# Patient Record
Sex: Female | Born: 1985 | Hispanic: Yes | Marital: Married | Smoking: Never smoker
Health system: Southern US, Community
[De-identification: ages and names within clinical notes are randomized; demographics above are authoritative.]

## PROBLEM LIST (undated history)

## (undated) ENCOUNTER — Inpatient Hospital Stay (HOSPITAL_COMMUNITY): Payer: Self-pay

## (undated) HISTORY — PX: CHOLECYSTECTOMY: SHX55

## (undated) HISTORY — PX: NO PAST SURGERIES: SHX2092

---

## 2011-10-30 ENCOUNTER — Other Ambulatory Visit (HOSPITAL_COMMUNITY): Payer: Self-pay | Admitting: Obstetrics and Gynecology

## 2011-10-30 DIAGNOSIS — Z3689 Encounter for other specified antenatal screening: Secondary | ICD-10-CM

## 2011-11-05 ENCOUNTER — Ambulatory Visit (HOSPITAL_COMMUNITY)
Admission: RE | Admit: 2011-11-05 | Discharge: 2011-11-05 | Disposition: A | Discharge status: Home / Self Care | Payer: Medicaid Other | Source: Ambulatory Visit | Attending: Obstetrics and Gynecology | Admitting: Obstetrics and Gynecology

## 2011-11-05 DIAGNOSIS — Z3689 Encounter for other specified antenatal screening: Secondary | ICD-10-CM

## 2011-11-05 DIAGNOSIS — Z363 Encounter for antenatal screening for malformations: Secondary | ICD-10-CM | POA: Insufficient documentation

## 2011-11-05 DIAGNOSIS — Z1389 Encounter for screening for other disorder: Secondary | ICD-10-CM | POA: Insufficient documentation

## 2011-11-05 DIAGNOSIS — O358XX0 Maternal care for other (suspected) fetal abnormality and damage, not applicable or unspecified: Secondary | ICD-10-CM | POA: Insufficient documentation

## 2011-11-13 LAB — OB RESULTS CONSOLE ANTIBODY SCREEN: Antibody Screen: NEGATIVE

## 2011-11-13 LAB — OB RESULTS CONSOLE RUBELLA ANTIBODY, IGM: Rubella: IMMUNE

## 2011-11-13 LAB — OB RESULTS CONSOLE HEPATITIS B SURFACE ANTIGEN: Hepatitis B Surface Ag: NEGATIVE

## 2011-11-13 LAB — OB RESULTS CONSOLE GC/CHLAMYDIA: Chlamydia: NEGATIVE

## 2011-11-13 LAB — OB RESULTS CONSOLE ABO/RH

## 2011-11-29 ENCOUNTER — Inpatient Hospital Stay (HOSPITAL_COMMUNITY)
Admission: AD | Admit: 2011-11-29 | Discharge: 2011-11-30 | Disposition: A | Discharge status: Home / Self Care | Payer: Medicaid Other | Source: Ambulatory Visit | Attending: Obstetrics and Gynecology | Admitting: Obstetrics and Gynecology

## 2011-11-29 ENCOUNTER — Encounter (HOSPITAL_COMMUNITY): Payer: Self-pay | Admitting: *Deleted

## 2011-11-29 ENCOUNTER — Emergency Department (HOSPITAL_COMMUNITY)
Admission: EM | Admit: 2011-11-29 | Discharge: 2011-11-29 | Payer: Medicaid Other | Attending: Emergency Medicine | Admitting: Emergency Medicine

## 2011-11-29 DIAGNOSIS — O4700 False labor before 37 completed weeks of gestation, unspecified trimester: Secondary | ICD-10-CM | POA: Insufficient documentation

## 2011-11-29 DIAGNOSIS — O479 False labor, unspecified: Secondary | ICD-10-CM

## 2011-11-29 DIAGNOSIS — R109 Unspecified abdominal pain: Secondary | ICD-10-CM | POA: Insufficient documentation

## 2011-11-29 NOTE — Progress Notes (Signed)
Discussed with patient of plan of care to transfer to [facility] via private vehicle for further evaluation. Pt verbalized understanding

## 2011-11-29 NOTE — Progress Notes (Signed)
Patient transferred to room A11 ambulatory

## 2011-11-29 NOTE — Progress Notes (Signed)
Report given to Dr. Jackelyn Knife. Patient may transfer to [facility] for further evaluation via private vehicle

## 2011-11-29 NOTE — ED Notes (Signed)
Patient said her contractions started at 0900 and pain started getting worse and she came in.  Contractions are 5-30mins apart.

## 2011-11-29 NOTE — Progress Notes (Signed)
Patient in triage room. FHR monitor not working at this time. Paper monitoring at this time

## 2011-11-29 NOTE — Progress Notes (Signed)
Patient in waiting room. Informed RN that OBRR RN here to evaluate patient

## 2011-11-29 NOTE — ED Notes (Signed)
Patient was explained discharge instructions and she had no questions.  Patient is being transported by POV by husband, Jerelene Redden to [facility] per Dr. Derrel Nip for further monitoring.

## 2011-11-29 NOTE — Progress Notes (Signed)
Received call from RN with report about patient

## 2011-11-29 NOTE — ED Notes (Signed)
Pt states lower pelvic contraction type pain. Pt states every 5-7 min. Pt states that they aren't too bad. Pt has 1 live child and this is pt 2nd pregnancy. Pt denies fluid or bleeding. Pt has not heard of braxton hicks contractions. OBGYN RN called will come by ED. Pt states feels baby move

## 2011-11-29 NOTE — ED Notes (Signed)
OB nurse Lanora Manis is present at bedside monitoring Fetal  Heart Tones.

## 2011-11-29 NOTE — ED Notes (Signed)
Telephoned OB Rapid Response RN Marisue Ivan [phone] and made her aware of patient presenting to ED with complaints of having contractions.

## 2011-11-29 NOTE — ED Provider Notes (Addendum)
History     CSN: [id number]  Arrival date & time 11/29/11  2111   First MD Initiated Contact with Patient 11/29/11 2248      Chief Complaint  Patient presents with  . Abdominal Pain    contractions type feeling    (Consider location/radiation/quality/duration/timing/severity/associated sxs/prior treatment) HPI This is a 26 year old female about 29-1/[redacted] weeks pregnant. This is her second pregnancy. She's been having pelvic pain since about noon today. The pains occur 5 to 7 minutes and are moderate in intensity. They're characterized as like contractions with previous pregnancy. She has had no rupture of membranes. She was placed on the monitor by her nurse and the OB rapid response nurse, Marisue Ivan, is present in the room at this time.   History reviewed. No pertinent past medical history.  Past Surgical History  Procedure Date  . Cholecystectomy     History reviewed. No pertinent family history.  History  Substance Use Topics  . Smoking status: Never Smoker   . Smokeless tobacco: Not on file  . Alcohol Use: No    OB History    Grav Para Term Preterm Abortions TAB SAB Ect Mult Living                  Review of Systems  All other systems reviewed and are negative.    Allergies  Review of patient's allergies indicates no known allergies.  Home Medications  No current outpatient prescriptions on file.  BP 121/78  Pulse 79  Temp 98.7 F (37.1 C) (Oral)  Resp 18  SpO2 98%  Physical Exam General: Well-developed, well-nourished female in no acute distress; appearance consistent with age of record HENT: normocephalic, atraumatic Eyes: pupils equal round and reactive to light; extraocular muscles intact Neck: supple Heart: regular rate and rhythm Lungs: clear to auscultation bilaterally Abdomen: soft; nontender; gravid, consistent with dates; bowel sounds present OB: Fetal heart tones 140s to 150s Extremities: No deformity; full range of motion; pulses normal; no  edema Neurologic: Awake, alert and oriented; motor function intact in all extremities and symmetric; no facial droop Skin: Warm and dry Psychiatric: Normal mood and affect    ED Course  Procedures (including critical care time)     MDM  11:04 PM Dr. Jackelyn Knife requests patient be transferred to [facility] MAU by private vehicle.  Nursing notes and vitals signs, including pulse oximetry, reviewed.  Summary of this visit's results, reviewed by myself:  Labs:  Results for orders placed during the hospital encounter of 11/29/11  OB RESULTS CONSOLE GC/CHLAMYDIA      Component Value Range   Gonorrhea Negative     Chlamydia Negative    OB RESULTS CONSOLE RPR      Component Value Range   RPR Nonreactive    OB RESULTS CONSOLE HIV ANTIBODY (ROUTINE TESTING)      Component Value Range   HIV Non-reactive    OB RESULTS CONSOLE RUBELLA ANTIBODY, IGM      Component Value Range   Rubella Immune    OB RESULTS CONSOLE HEPATITIS B SURFACE ANTIGEN      Component Value Range   Hepatitis B Surface Ag Negative    OB RESULTS CONSOLE ABO/RH      Component Value Range   RH Type  Positive     ABO Grouping O    OB RESULTS CONSOLE ANTIBODY SCREEN      Component Value Range   Antibody Screen Negative  Hanley Seamen, MD 11/29/11 1610  Hanley Seamen, MD 11/29/11 [phone]

## 2011-11-30 ENCOUNTER — Encounter (HOSPITAL_COMMUNITY): Payer: Self-pay | Admitting: *Deleted

## 2011-11-30 LAB — URINALYSIS, ROUTINE W REFLEX MICROSCOPIC
Nitrite: NEGATIVE
Protein, ur: NEGATIVE mg/dL
Specific Gravity, Urine: 1.02 (ref 1.005–1.030)
Urobilinogen, UA: 0.2 mg/dL (ref 0.0–1.0)

## 2011-11-30 MED ORDER — LACTATED RINGERS IV SOLN
INTRAVENOUS | Status: DC
  Administered 2011-11-30: 01:00:00 via INTRAVENOUS

## 2011-11-30 MED ORDER — TERBUTALINE SULFATE 1 MG/ML IJ SOLN
INTRAMUSCULAR | Status: AC
  Administered 2011-11-30: 01:00:00
  Filled 2011-11-30: qty 1

## 2011-11-30 MED ORDER — NIFEDIPINE 10 MG PO CAPS
20.0000 mg | ORAL_CAPSULE | ORAL | Status: AC
  Administered 2011-11-30 (×2): 20 mg via ORAL
  Filled 2011-11-30 (×2): qty 2

## 2011-11-30 MED ORDER — ZOLPIDEM TARTRATE 5 MG PO TABS
5.0000 mg | ORAL_TABLET | Freq: Once | ORAL | Status: AC
  Administered 2011-11-30: 5 mg via ORAL
  Filled 2011-11-30: qty 1

## 2011-11-30 MED ORDER — BETAMETHASONE SOD PHOS & ACET 6 (3-3) MG/ML IJ SUSP
12.0000 mg | Freq: Once | INTRAMUSCULAR | Status: AC
  Administered 2011-11-30: 12 mg via INTRAMUSCULAR
  Filled 2011-11-30: qty 2

## 2011-11-30 MED ORDER — TERBUTALINE SULFATE 1 MG/ML IJ SOLN: 0.2500 mg | Freq: Once | INTRAMUSCULAR | Status: DC

## 2011-11-30 NOTE — MAU Note (Signed)
Pt states that she went to [facility] ED and they sent her here for eval-she was montiored at [facility] and had a SVE done by "a nurse that came from here"

## 2011-11-30 NOTE — Progress Notes (Signed)
FHT from this am reviewed.  Reactive NST, consistent mostly regular ctx, but cervix did not change.

## 2011-11-30 NOTE — MAU Provider Note (Signed)
Chief Complaint:  Contractions  First Provider Initiated Contact with Patient 11/30/11 0040    HPI: Destiny Fernandez is a 26 y.o. G2P1001 at [redacted]w[redacted]d who was sent to maternity admissions from [facility] ED for preterm contractions. Rates pain 8/10 on pain scale. Cervix 1 and long per Rapid Response OB nurse. No fFN done. Denies leakage of fluid, vaginal bleeding, urinary complaints, GI complaints, IC in past 24 hours, Hx of PTL or PTD. Good fetal movement.   Past Medical History: History reviewed. No pertinent past medical history.  Past obstetric history: OB History    Grav Para Term Preterm Abortions TAB SAB Ect Mult Living   2 1 1  0 0 0 0 0 0 1     # Outc Date GA Lbr Len/2nd Wgt Sex Del Anes PTL Lv   1 TRM            2 CUR               Past Surgical History: Past Surgical History  Procedure Date  . Cholecystectomy        Family History: History reviewed. No pertinent family history.  Social History: History  Substance Use Topics  . Smoking status: Never Smoker   . Smokeless tobacco: Never Used  . Alcohol Use: No    Allergies: No Known Allergies  Meds:  Prescriptions prior to admission  Medication Sig Dispense Refill  . Prenatal Vit-Fe Fumarate-FA ([employer] PRENATAL PO) Take 1 tablet by mouth daily.        ROS: Pertinent findings in history of present illness.  Physical Exam  Blood pressure 122/75, pulse 79, temperature 99.1 F (37.3 C), temperature source Oral, resp. rate 20, height 5\' 2"  (1.575 m), weight 69.967 kg (154 lb 4 oz), last menstrual period 05/09/2011, SpO2 99.00%. GENERAL: Well-developed, well-nourished female in no acute distress.  HEENT: normocephalic HEART: normal rate RESP: normal effort ABDOMEN: Soft, non-tender, gravid appropriate for gestational age EXTREMITIES: Nontender, no edema NEURO: alert and oriented SPECULUM EXAM: deferred  Dilation: 1 Effacement (%): Thick Cervical Position: Posterior Exam by:: V.Jeanell Mangan,CNM  FHT:  Baseline 140 ,  moderate variability, no accelerations present, no decelerations Contractions: q 2-3 mins, mild   Labs: Results for orders placed during the hospital encounter of 11/29/11 (from the past 24 hour(s))  URINALYSIS, ROUTINE W REFLEX MICROSCOPIC     Status: Normal   Collection Time   11/30/11 12:01 AM      Component Value Range   Color, Urine YELLOW  YELLOW   APPearance CLEAR  CLEAR   Specific Gravity, Urine 1.020  1.005 - 1.030   pH 6.5  5.0 - 8.0   Glucose, UA NEGATIVE  NEGATIVE mg/dL   Hgb urine dipstick NEGATIVE  NEGATIVE   Bilirubin Urine NEGATIVE  NEGATIVE   Ketones, ur NEGATIVE  NEGATIVE mg/dL   Protein, ur NEGATIVE  NEGATIVE mg/dL   Urobilinogen, UA 0.2  0.0 - 1.0 mg/dL   Nitrite NEGATIVE  NEGATIVE   Leukocytes, UA NEGATIVE  NEGATIVE    Imaging:   MAU Course: 0045: Terbutaline Austin given per consult w/ Dr. Jackelyn Knife.  4540: Brief improvement after terbutaline and IV bolus, then return to UC's Q 3, 8/10 on pain scale. Cervix unchanged. Procardia 20 mg PRN x 2 doses ordered per Dr. Jackelyn Knife.Marland Kitchen  0515: No cervical change. Contractions unchanged. Will give BMZ and D/C per Dr. Jackelyn Knife. 0630: Requesting pain meds for D/C. Will not give any per Dr. Jackelyn Knife due to not wanting to mask contractions, but  may have Ambien, given. Cervix unchanged.  Assessment: 1. Preterm contractions    Plan: Discharge home Preterm labor precautions and fetal kick counts Return to MAU for BMZ #2 12/01/11 at 7:00. Increase rest and fluids. Follow-up Information    Call MEISINGER,TODD D, MD. (this afternoon to discuss how contractions are feeling )    Contact information:   [address], [address] [place] [address] [phone]       Follow up with [facility] ADMISS[facility]. On 12/01/2011. (At 7:00 am for second steroid shot or as needed if symptoms worsen)    Contact information:   [address] [id number] mc [place]  [address] [phone]           Medication List     As of 11/30/2011  7:32 AM    TAKE these medications         CVS PRENATAL PO   Take 1 tablet by mouth daily.         [place], CNM 11/30/2011 12:39 AM

## 2011-12-01 ENCOUNTER — Inpatient Hospital Stay (HOSPITAL_COMMUNITY)
Admission: AD | Admit: 2011-12-01 | Discharge: 2011-12-01 | Disposition: A | Discharge status: Home / Self Care | Payer: Medicaid Other | Source: Ambulatory Visit | Attending: Obstetrics and Gynecology | Admitting: Obstetrics and Gynecology

## 2011-12-01 DIAGNOSIS — O4700 False labor before 37 completed weeks of gestation, unspecified trimester: Secondary | ICD-10-CM | POA: Insufficient documentation

## 2011-12-01 MED ORDER — BETAMETHASONE SOD PHOS & ACET 6 (3-3) MG/ML IJ SUSP
12.0000 mg | Freq: Once | INTRAMUSCULAR | Status: AC
  Administered 2011-12-01: 12 mg via INTRAMUSCULAR
  Filled 2011-12-01: qty 2

## 2012-01-14 NOTE — L&D Delivery Note (Signed)
Delivery Note At 5:43 AM a viable female was delivered via Vaginal, Spontaneous Delivery (Presentation: Left Occiput Anterior).  APGAR: 8, 9; weight pending.   Placenta status: Intact, Spontaneous.  Cord:  with the following complications: None.   Anesthesia: None  Episiotomy: None Lacerations: None Suture Repair: none Est. Blood Loss (mL): 400  Mom to postpartum.  Baby to stay with mom.  Alante Tolan D 02/04/2012, 6:02 AM

## 2012-01-20 LAB — OB RESULTS CONSOLE GBS: GBS: NEGATIVE

## 2012-02-03 ENCOUNTER — Inpatient Hospital Stay (HOSPITAL_COMMUNITY)
Admission: AD | Admit: 2012-02-03 | Discharge: 2012-02-06 | DRG: 775 | Disposition: A | Discharge status: Home / Self Care | Payer: Medicaid Other | Source: Ambulatory Visit | Attending: Obstetrics and Gynecology | Admitting: Obstetrics and Gynecology

## 2012-02-03 DIAGNOSIS — IMO0001 Reserved for inherently not codable concepts without codable children: Secondary | ICD-10-CM

## 2012-02-04 ENCOUNTER — Encounter (HOSPITAL_COMMUNITY): Payer: Self-pay | Admitting: *Deleted

## 2012-02-04 DIAGNOSIS — IMO0001 Reserved for inherently not codable concepts without codable children: Secondary | ICD-10-CM

## 2012-02-04 LAB — RPR: RPR Ser Ql: NONREACTIVE

## 2012-02-04 LAB — TYPE AND SCREEN
ABO/RH(D): O POS
Antibody Screen: NEGATIVE

## 2012-02-04 LAB — POCT FERN TEST: POCT Fern Test: NEGATIVE

## 2012-02-04 LAB — CBC
HCT: 36.5 % (ref 36.0–46.0)
Hemoglobin: 12 g/dL (ref 12.0–15.0)
MCHC: 32.9 g/dL (ref 30.0–36.0)
RBC: 4.12 MIL/uL (ref 3.87–5.11)

## 2012-02-04 LAB — ABO/RH: ABO/RH(D): O POS

## 2012-02-04 MED ORDER — OXYCODONE-ACETAMINOPHEN 5-325 MG PO TABS
1.0000 | ORAL_TABLET | ORAL | Status: DC | PRN
Start: 2012-02-04 — End: 2012-02-04

## 2012-02-04 MED ORDER — LANOLIN HYDROUS EX OINT: TOPICAL_OINTMENT | CUTANEOUS | Status: DC | PRN

## 2012-02-04 MED ORDER — WITCH HAZEL-GLYCERIN EX PADS: 1.0000 "application " | MEDICATED_PAD | CUTANEOUS | Status: DC | PRN

## 2012-02-04 MED ORDER — OXYTOCIN BOLUS FROM INFUSION: 500.0000 mL | INTRAVENOUS | Status: DC

## 2012-02-04 MED ORDER — MEASLES, MUMPS & RUBELLA VAC ~~LOC~~ INJ: 0.5000 mL | INJECTION | Freq: Once | SUBCUTANEOUS | Status: DC

## 2012-02-04 MED ORDER — BENZOCAINE-MENTHOL 20-0.5 % EX AERO
1.0000 "application " | INHALATION_SPRAY | CUTANEOUS | Status: DC | PRN
  Administered 2012-02-04: 1 via TOPICAL
  Filled 2012-02-04: qty 56

## 2012-02-04 MED ORDER — SIMETHICONE 80 MG PO CHEW: 80.0000 mg | CHEWABLE_TABLET | ORAL | Status: DC | PRN

## 2012-02-04 MED ORDER — DIBUCAINE 1 % RE OINT: 1.0000 "application " | TOPICAL_OINTMENT | RECTAL | Status: DC | PRN

## 2012-02-04 MED ORDER — METHYLERGONOVINE MALEATE 0.2 MG/ML IJ SOLN: 0.2000 mg | INTRAMUSCULAR | Status: DC | PRN

## 2012-02-04 MED ORDER — BUTORPHANOL TARTRATE 1 MG/ML IJ SOLN
1.0000 mg | INTRAMUSCULAR | Status: DC | PRN
  Administered 2012-02-04 (×2): 1 mg via INTRAVENOUS
  Filled 2012-02-04 (×2): qty 1

## 2012-02-04 MED ORDER — MAGNESIUM HYDROXIDE 400 MG/5ML PO SUSP: 30.0000 mL | ORAL | Status: DC | PRN

## 2012-02-04 MED ORDER — SENNOSIDES-DOCUSATE SODIUM 8.6-50 MG PO TABS
2.0000 | ORAL_TABLET | Freq: Every day | ORAL | Status: DC
  Administered 2012-02-05 (×2): 2 via ORAL

## 2012-02-04 MED ORDER — IBUPROFEN 600 MG PO TABS: 600.0000 mg | ORAL_TABLET | Freq: Four times a day (QID) | ORAL | Status: DC | PRN

## 2012-02-04 MED ORDER — IBUPROFEN 600 MG PO TABS
600.0000 mg | ORAL_TABLET | Freq: Four times a day (QID) | ORAL | Status: DC
  Administered 2012-02-04 – 2012-02-06 (×7): 600 mg via ORAL
  Filled 2012-02-04 (×7): qty 1

## 2012-02-04 MED ORDER — LACTATED RINGERS IV SOLN: 500.0000 mL | INTRAVENOUS | Status: DC | PRN

## 2012-02-04 MED ORDER — OXYCODONE-ACETAMINOPHEN 5-325 MG PO TABS
1.0000 | ORAL_TABLET | ORAL | Status: DC | PRN
  Administered 2012-02-04 (×2): 1 via ORAL
  Administered 2012-02-05 – 2012-02-06 (×5): 2 via ORAL
  Filled 2012-02-04: qty 1
  Filled 2012-02-04: qty 2
  Filled 2012-02-04: qty 1
  Filled 2012-02-04 (×4): qty 2

## 2012-02-04 MED ORDER — CITRIC ACID-SODIUM CITRATE 334-500 MG/5ML PO SOLN: 30.0000 mL | ORAL | Status: DC | PRN

## 2012-02-04 MED ORDER — DIPHENHYDRAMINE HCL 25 MG PO CAPS: 25.0000 mg | ORAL_CAPSULE | Freq: Four times a day (QID) | ORAL | Status: DC | PRN

## 2012-02-04 MED ORDER — ONDANSETRON HCL 4 MG/2ML IJ SOLN: 4.0000 mg | Freq: Four times a day (QID) | INTRAMUSCULAR | Status: DC | PRN

## 2012-02-04 MED ORDER — LACTATED RINGERS IV SOLN
INTRAVENOUS | Status: DC
  Administered 2012-02-04 (×2): via INTRAVENOUS

## 2012-02-04 MED ORDER — PRENATAL MULTIVITAMIN CH
1.0000 | ORAL_TABLET | Freq: Every day | ORAL | Status: DC
  Administered 2012-02-04 – 2012-02-06 (×3): 1 via ORAL
  Filled 2012-02-04 (×3): qty 1

## 2012-02-04 MED ORDER — ZOLPIDEM TARTRATE 5 MG PO TABS: 5.0000 mg | ORAL_TABLET | Freq: Every evening | ORAL | Status: DC | PRN

## 2012-02-04 MED ORDER — METHYLERGONOVINE MALEATE 0.2 MG PO TABS: 0.2000 mg | ORAL_TABLET | ORAL | Status: DC | PRN

## 2012-02-04 MED ORDER — ONDANSETRON HCL 4 MG/2ML IJ SOLN: 4.0000 mg | INTRAMUSCULAR | Status: DC | PRN

## 2012-02-04 MED ORDER — OXYTOCIN 40 UNITS IN LACTATED RINGERS INFUSION - SIMPLE MED
62.5000 mL/h | INTRAVENOUS | Status: DC
  Administered 2012-02-04: 999 mL/h via INTRAVENOUS
  Filled 2012-02-04 (×2): qty 1000

## 2012-02-04 MED ORDER — FLEET ENEMA 7-19 GM/118ML RE ENEM: 1.0000 | ENEMA | RECTAL | Status: DC | PRN

## 2012-02-04 MED ORDER — TETANUS-DIPHTH-ACELL PERTUSSIS 5-2.5-18.5 LF-MCG/0.5 IM SUSP: 0.5000 mL | Freq: Once | INTRAMUSCULAR | Status: DC

## 2012-02-04 MED ORDER — ACETAMINOPHEN 325 MG PO TABS: 650.0000 mg | ORAL_TABLET | ORAL | Status: DC | PRN

## 2012-02-04 MED ORDER — ONDANSETRON HCL 4 MG PO TABS
4.0000 mg | ORAL_TABLET | ORAL | Status: DC | PRN
Start: 2012-02-04 — End: 2012-02-06

## 2012-02-04 MED ORDER — LIDOCAINE HCL (PF) 1 % IJ SOLN
30.0000 mL | INTRAMUSCULAR | Status: DC | PRN
  Filled 2012-02-04: qty 30

## 2012-02-04 NOTE — MAU Provider Note (Signed)
Speculum exam:  Done to evaluate for rupture of membranes.  No pooling with valsalva.  Has lots of cervical mucus with blood - some pink, some red.  Fern slide made for RN to read.  Clinical observational assessment - unlikely that membranes have ruptured.

## 2012-02-04 NOTE — MAU Note (Signed)
Patient complains of contractions since the day before yesterday. Was seen at the office yesterday and was 2cm. For the last two hours the contractions have been more intense and happening every 5-7 mins

## 2012-02-04 NOTE — Progress Notes (Signed)
UR chart review completed.  

## 2012-02-04 NOTE — MAU Note (Signed)
Patient returned from walking stating the bad was "too bad to walk anymore". Also that she noticed some leaking of fluid.

## 2012-02-04 NOTE — H&P (Signed)
Destiny Fernandez is a 27 y.o. female, G2 P1001, EGA 39+ weeks with [facility] 1-27 presenting for evaluation of reg ctx.  On eval in MAU, ctx q 5 min, cervix chaanged from 3 to 3-4 cm over an hour.  Pt admitted and continued to progress, unsure of membrane rupture.  On my first exam, she was a rim dilated, no membranes palpable.  Prenatal care complicated by preterm ctx at 29 weeks, received BMZ, no other complications.  See prenatal records for complete history.  Maternal Medical History:  Reason for admission: Reason for admission: contractions.  Contractions: Frequency: regular.   Perceived severity is strong.    Fetal activity: Perceived fetal activity is normal.    Prenatal complications: no prenatal complications   OB History    Grav Para Term Preterm Abortions TAB SAB Ect Mult Living   2 1 1  0 0 0 0 0 0 1    SVD at 40 weeks, 6 lbs 2 oz, no comps  History reviewed. No pertinent past medical history. Past Surgical History  Procedure Date  . Cholecystectomy   . No past surgeries    Family History: family history is not on file. Social History:  reports that she has never smoked. She has never used smokeless tobacco. She reports that she does not drink alcohol or use illicit drugs.   Prenatal Transfer Tool  Maternal Diabetes: No Genetic Screening: Declined Maternal Ultrasounds/Referrals: Normal Fetal Ultrasounds or other Referrals:  None Maternal Substance Abuse:  No Significant Maternal Medications:  None Significant Maternal Lab Results:  Lab values include: Group B Strep negative Other Comments:  None  Review of Systems  Respiratory: Negative.   Cardiovascular: Negative.     Dilation: 9 Effacement (%): 100 Station: 0 Exam by:: T Cresenzo RN Blood pressure 126/75, pulse 92, temperature 98.5 F (36.9 C), temperature source Tympanic, resp. rate 18, height 5\' 2"  (1.575 m), weight 72.122 kg (159 lb), last menstrual period 05/09/2011. Maternal Exam:  Uterine Assessment:  Contraction strength is firm.  Contraction frequency is regular.   Abdomen: Patient reports no abdominal tenderness. Estimated fetal weight is 6 lbs.   Fetal presentation: vertex  Introitus: Normal vulva. Normal vagina.  Pelvis: adequate for delivery.   Cervix: Cervix evaluated by digital exam.     Fetal Exam Fetal Monitor Review: Mode: ultrasound.   Variability: moderate (6-25 bpm).   Pattern: accelerations present and no decelerations.    Fetal State Assessment: Category I - tracings are normal.     Physical Exam  Constitutional: She appears well-developed and well-nourished.  Cardiovascular: Normal rate, regular rhythm and normal heart sounds.   No murmur heard. Respiratory: Effort normal and breath sounds normal. No respiratory distress.  GI: Soft.       Gravid     Prenatal labs: ABO, Rh: --/--/O POS, O POS (01/22 0230) Antibody: NEG (01/22 0230) Rubella: Immune (10/31 0000) RPR: Nonreactive (10/31 0000)  HBsAg: Negative (10/31 0000)  HIV: Non-reactive (10/31 0000)  GBS: Negative (01/07 0000)  GCT:  103  Assessment/Plan: IUP at 39+ weeks in active labor, making good progress, see delivery note.   Sylis Ketchum D 02/04/2012, 5:56 AM

## 2012-02-05 NOTE — Progress Notes (Signed)
PPD #1 No problems Afeb, VSS Fundus firm, NT at U-1 Continue routine postpartum care 

## 2012-02-06 MED ORDER — OXYCODONE-ACETAMINOPHEN 5-325 MG PO TABS: 1.0000 | ORAL_TABLET | ORAL | Status: AC | PRN

## 2012-02-06 MED ORDER — OXYCODONE-ACETAMINOPHEN 5-325 MG PO TABS: 1.0000 | ORAL_TABLET | ORAL | Status: DC | PRN

## 2012-02-06 MED ORDER — IBUPROFEN 600 MG PO TABS: 600.0000 mg | ORAL_TABLET | Freq: Four times a day (QID) | ORAL | Status: AC

## 2012-02-06 NOTE — Discharge Summary (Signed)
Obstetric Discharge Summary Reason for Admission: onset of labor Prenatal Procedures: none Intrapartum Procedures: spontaneous vaginal delivery Postpartum Procedures: none Complications-Operative and Postpartum: none Hemoglobin  Date Value Range Status  02/04/2012 12.0  12.0 - 15.0 g/dL Final     HCT  Date Value Range Status  02/04/2012 36.5  36.0 - 46.0 % Final    Physical Exam:  General: alert Lochia: appropriate Uterine Fundus: firm  Discharge Diagnoses: Term Pregnancy-delivered  Discharge Information: Date: 02/06/2012 Activity: pelvic rest Diet: routine Medications: Ibuprofen and Percocet Condition: stable Instructions: refer to practice specific booklet Discharge to: home Follow-up Information    Follow up with Randol Zumstein D, MD. Schedule an appointment as soon as possible for a visit in 6 weeks.   Contact information:   [address], [address] [place] [address] [phone]          Newborn Data: Live born female  Birth Weight: 5 lb 6 oz (2438 g) APGAR: 8, 9  Home with mother.  Kenta Laster D 02/06/2012, 9:23 AM

## 2012-02-06 NOTE — Progress Notes (Signed)
PPD #2 No problems Afeb, VSS D/c home 

## 2012-02-12 ENCOUNTER — Ambulatory Visit (HOSPITAL_COMMUNITY)
Admit: 2012-02-12 | Discharge: 2012-02-12 | Disposition: A | Discharge status: Home / Self Care | Payer: Medicaid Other | Attending: Obstetrics and Gynecology | Admitting: Obstetrics and Gynecology

## 2012-02-12 NOTE — Progress Notes (Signed)
Adult Lactation Consultation Outpatient Visit Note  Patient Name: Destiny Fernandez Date of Birth: February 28, 1985 Gestational Age at Delivery: Unknown Type of Delivery: Vaginal  Birth weight 5# 6oz   Breastfeeding History: Frequency of Breastfeeding: 12 times in 24 hours Length of Feeding:  Voids: 6+ Stools: 5+  Supplementing / Method: Pumping:  Type of Pump: not pumping   Frequency:  Volume:    Comments: Has been using a nipple shield and has been unable to [employer] with out it.  Helped her with positioning and [name] was able to latch without the shield.  Mom was able to latch her with coaching.    Consultation Evaluation:  Initial Feeding Assessment: Pre-feed [id number]:0454 Post-feed UJWJXB:1478 Amount Transferred:70 Comments:  Additional Feeding Assessment: Pre-feed GNFAOZ:3086 Post-feed VHQION:6295 (fell asleep) Amount Transferred:2 ml Comments:Could not latch to the left side without the NS.  Decreased size to a 20 mm. [name] latched but quickly fell asleep.  Additional Feeding Assessment: Pre-feed Weight: Post-feed Weight: Amount Transferred: Comments:  Total Breast milk Transferred this Visit:  Total Supplement Given:   Additional Interventions:   Follow-Up  Will continue trying to latch without the NS.  Smart Start to weigh on Monday and to call with the weight to lactation.  Lactation appointment scheduled for Thursday 02/19/12    Soyla Dryer 02/12/2012, 2:45 PM

## 2012-02-19 ENCOUNTER — Ambulatory Visit (HOSPITAL_COMMUNITY): Admission: RE | Admit: 2012-02-19 | Payer: Medicaid Other | Source: Ambulatory Visit

## 2013-11-14 ENCOUNTER — Encounter (HOSPITAL_COMMUNITY): Payer: Self-pay | Admitting: *Deleted

## 2016-05-12 ENCOUNTER — Encounter (HOSPITAL_COMMUNITY): Payer: Self-pay | Admitting: Emergency Medicine

## 2016-05-12 ENCOUNTER — Emergency Department (HOSPITAL_COMMUNITY)
Admission: EM | Admit: 2016-05-12 | Discharge: 2016-05-13 | Disposition: A | Discharge status: Home / Self Care | Payer: Medicaid Other | Attending: Emergency Medicine | Admitting: Emergency Medicine

## 2016-05-12 DIAGNOSIS — B349 Viral infection, unspecified: Secondary | ICD-10-CM | POA: Diagnosis not present

## 2016-05-12 DIAGNOSIS — K2970 Gastritis, unspecified, without bleeding: Secondary | ICD-10-CM | POA: Insufficient documentation

## 2016-05-12 DIAGNOSIS — R1010 Upper abdominal pain, unspecified: Secondary | ICD-10-CM | POA: Diagnosis present

## 2016-05-12 LAB — BASIC METABOLIC PANEL
Anion gap: 11 (ref 5–15)
BUN: 12 mg/dL (ref 6–20)
CO2: 22 mmol/L (ref 22–32)
CREATININE: 0.66 mg/dL (ref 0.44–1.00)
Calcium: 9.4 mg/dL (ref 8.9–10.3)
Chloride: 106 mmol/L (ref 101–111)
GFR calc Af Amer: >60 mL/min (ref >60)
Glucose, Bld: 86 mg/dL (ref 65–99)
Potassium: 3.4 mmol/L — ABNORMAL LOW (ref 3.5–5.1)
SODIUM: 139 mmol/L (ref 135–145)

## 2016-05-12 LAB — URINALYSIS, ROUTINE W REFLEX MICROSCOPIC
Bacteria, UA: NONE SEEN
Bilirubin Urine: NEGATIVE
GLUCOSE, UA: NEGATIVE mg/dL
Hgb urine dipstick: NEGATIVE
KETONES UR: 80 mg/dL — AB
Leukocytes, UA: NEGATIVE
Nitrite: NEGATIVE
PH: 5 (ref 5.0–8.0)
PROTEIN: 30 mg/dL — AB
Specific Gravity, Urine: 1.029 (ref 1.005–1.030)

## 2016-05-12 LAB — CBC WITH DIFFERENTIAL/PLATELET
BASOS ABS: 0 10*3/uL (ref 0.0–0.1)
Basophils Relative: 0 %
Eosinophils Absolute: 0 10*3/uL (ref 0.0–0.7)
Eosinophils Relative: 0 %
HEMATOCRIT: 38.1 % (ref 36.0–46.0)
Hemoglobin: 13 g/dL (ref 12.0–15.0)
LYMPHS PCT: 11 %
Lymphs Abs: 1 10*3/uL (ref 0.7–4.0)
MCH: 31.6 pg (ref 26.0–34.0)
MCHC: 34.1 g/dL (ref 30.0–36.0)
MCV: 92.7 fL (ref 78.0–100.0)
MONO ABS: 0.4 10*3/uL (ref 0.1–1.0)
MONOS PCT: 4 %
NEUTROS ABS: 7.6 10*3/uL (ref 1.7–7.7)
Neutrophils Relative %: 85 %
Platelets: 190 10*3/uL (ref 150–400)
RBC: 4.11 MIL/uL (ref 3.87–5.11)
RDW: 12.3 % (ref 11.5–15.5)
WBC: 9 10*3/uL (ref 4.0–10.5)

## 2016-05-12 NOTE — ED Triage Notes (Signed)
Pt having 7/10 generalize body ache with nausea and vomiting since last Saturday. No able to eat or drink nothing. Denies any fever or chills.

## 2016-05-13 LAB — HEPATIC FUNCTION PANEL
ALK PHOS: 61 U/L (ref 38–126)
ALT: 16 U/L (ref 14–54)
AST: 18 U/L (ref 15–41)
Albumin: 4.1 g/dL (ref 3.5–5.0)
BILIRUBIN DIRECT: 0.1 mg/dL (ref 0.1–0.5)
BILIRUBIN INDIRECT: 0.5 mg/dL (ref 0.3–0.9)
Total Bilirubin: 0.6 mg/dL (ref 0.3–1.2)
Total Protein: 7.8 g/dL (ref 6.5–8.1)

## 2016-05-13 LAB — LIPASE, BLOOD: Lipase: 23 U/L (ref 11–51)

## 2016-05-13 MED ORDER — ONDANSETRON HCL 4 MG PO TABS: 4.0000 mg | ORAL_TABLET | Freq: Four times a day (QID) | ORAL | 0 refills | Status: AC

## 2016-05-13 MED ORDER — ONDANSETRON 4 MG PO TBDP
4.0000 mg | ORAL_TABLET | Freq: Once | ORAL | Status: AC
  Administered 2016-05-13: 4 mg via ORAL
  Filled 2016-05-13: qty 1

## 2016-05-13 NOTE — ED Provider Notes (Signed)
[facility]-EMERGENCY DEPT Provider Note   CSN: [id number] Arrival date & time: 05/12/16  1938     History   Chief Complaint Chief Complaint  Patient presents with  . Generalized Body Aches  . Emesis    HPI Destiny Fernandez is a 31 y.o. female.  HPI  31 y.o. female, presents to the Emergency Department today complaining of upper abdominal pain x 1 week with associated nausea. Noted emesis today PTA x 1. Notes no diarrhea. No fevers. No CP/SOB. Rates abdominal pain 7/10 in RUQ and feels like cramping sensation. Pt has had cholecystectomy in past. Notes no meds PTA. Pt is able to tolerate PO. Notes normal BMS. No other symptoms noted.   History reviewed. No pertinent past medical history.  There are no active problems to display for this patient.   Past Surgical History:  Procedure Laterality Date  . CHOLECYSTECTOMY    . NO PAST SURGERIES      OB History    Gravida Para Term Preterm AB Living   0 0 2   SAB TAB Ectopic Multiple Live Births   0 0 0 0 1       Home Medications    Prior to Admission medications   Medication Sig Start Date End Date Taking? Authorizing Provider  ibuprofen (ADVIL,MOTRIN) 600 MG tablet Take 1 tablet (600 mg total) by mouth every 6 (six) hours. 02/06/12   Lavina Hamman, MD  oxyCODONE-acetaminophen (PERCOCET/ROXICET) 5-325 MG per tablet Take 1 tablet by mouth every 4 (four) hours as needed (moderate - severe pain). 02/06/12   Lavina Hamman, MD  Prenatal Vit-Fe Fumarate-FA ([facility] PRENATAL PO) Take 1 tablet by mouth daily.    Historical Provider, MD    Family History History reviewed. No pertinent family history.  Social History Social History  Substance Use Topics  . Smoking status: Never Smoker  . Smokeless tobacco: Never Used  . Alcohol use No     Allergies   Patient has no known allergies.   Review of Systems Review of Systems ROS reviewed and all are negative for acute change except as noted in the HPI.  Physical  Exam Updated Vital Signs BP 101/74 (BP Location: Right Arm)   Pulse 61   Temp 98.9 F (37.2 C) (Oral)   Resp 18   Ht  (1.549 m)   Wt 67.6 kg   LMP 05/12/2016   SpO2 98%   BMI 28.15 kg/m   Physical Exam  Constitutional: She is oriented to person, place, and time. Vital signs are normal. She appears well-developed and well-nourished.  HENT:  Head: Normocephalic and atraumatic.  Right Ear: Hearing normal.  Left Ear: Hearing normal.  Eyes: Conjunctivae and EOM are normal. Pupils are equal, round, and reactive to light.  Neck: Normal range of motion. Neck supple.  Cardiovascular: Normal rate, regular rhythm and intact distal pulses.   Pulmonary/Chest: Effort normal and breath sounds normal. No respiratory distress. She has no wheezes. She has no rales. She exhibits no tenderness.  Abdominal: Soft. Normal appearance and bowel sounds are normal. There is no tenderness. There is no rigidity, no rebound, no guarding, no CVA tenderness, no tenderness at McBurney's point and negative Murphy's sign.  Musculoskeletal: Normal range of motion.  Neurological: She is alert and oriented to person, place, and time.  Skin: Skin is warm and dry.  Psychiatric: She has a normal mood and affect. Her speech is normal and behavior is normal. Thought content normal.  Nursing note and  vitals reviewed.   ED Treatments / Results  Labs (all labs ordered are listed, but only abnormal results are displayed) Labs Reviewed  BASIC METABOLIC PANEL - Abnormal; Notable for the following:       Result Value   Potassium 3.4 (*)    All other components within normal limits  URINALYSIS, ROUTINE W REFLEX MICROSCOPIC - Abnormal; Notable for the following:    APPearance HAZY (*)    Ketones, ur 80 (*)    Protein, ur 30 (*)    Squamous Epithelial / LPF 0-5 (*)    All other components within normal limits  CBC WITH DIFFERENTIAL/PLATELET  HEPATIC FUNCTION PANEL  LIPASE, BLOOD    EKG  EKG  Interpretation None       Radiology No results found.  Procedures Procedures (including critical care time)  Medications Ordered in ED Medications  ondansetron (ZOFRAN-ODT) disintegrating tablet 4 mg (4 mg Oral Given 05/13/16 0029)   Initial Impression / Assessment and Plan / ED Course  I have reviewed the triage vital signs and the nursing notes.  Pertinent labs & imaging results that were available during my care of the patient were reviewed by me and considered in my medical decision making (see chart for details).  Final Clinical Impressions(s) / ED Diagnoses  {I have reviewed and evaluated the relevant laboratory values.   {I have reviewed the relevant previous healthcare records.  {I obtained HPI from historian.   ED Course:  Assessment: Patient is a 31 y.o. female presents with abdominal pain x x 1 week. Epigastric. Associated N/V. No diarrhea. Pt without gallbladder. No fevers. On exam, nontoxic, nonseptic appearing, in no apparent distress. Patient's pain and other symptoms adequately managed in emergency department.  Labs and vitals reviewed.  Patient does not meet the SIRS or Sepsis criteria.  On repeat exam patient does not have a surgical abdomen and there are no peritoneal signs.  No indication of appendicitis, bowel obstruction, bowel perforation, cholecystitis, diverticulitis, PID or ectopic pregnancy. Likely gastritis vs viral syndrome. PO challenged with Zofran with succecss.  Patient discharged home with symptomatic treatment and given strict instructions for follow-up with their primary care physician.  I have also discussed reasons to return immediately to the ER.  Patient expresses understanding and agrees with plan.  Disposition/Plan:  DC Home Additional Verbal discharge instructions given and discussed with patient.  Pt Instructed to f/u with PCP in the next week for evaluation and treatment of symptoms. Return precautions given Pt acknowledges and agrees with  plan  Supervising Physician Zadie Rhine, MD  Final diagnoses:  Viral syndrome  Gastritis without bleeding, unspecified chronicity, unspecified gastritis type    New Prescriptions New Prescriptions   No medications on file     Audry Pili, PA-C 05/13/16 0131    Zadie Rhine, MD 05/13/16 [phone]

## 2016-05-13 NOTE — ED Notes (Signed)
Pt provided with ice water and instructed to notify RN if she vomits. Will cont to monitor.

## 2016-05-13 NOTE — Discharge Instructions (Signed)
Please read and follow all provided instructions.  Your diagnoses today include:  1. Viral syndrome   2. Gastritis without bleeding, unspecified chronicity, unspecified gastritis type    Tests performed today include: Blood counts and electrolytes Blood tests to check liver and kidney function Blood tests to check pancreas function Urine test to look for infection and pregnancy (in women) Vital signs. See below for your results today.   Medications prescribed:   Take any prescribed medications only as directed.  Home care instructions:  Follow any educational materials contained in this packet.  Follow-up instructions: Please follow-up with your primary care provider in the next 2 days for further evaluation of your symptoms.    Return instructions:  SEEK IMMEDIATE MEDICAL ATTENTION IF: The pain does not go away or becomes severe  A temperature above 101F develops  Repeated vomiting occurs (multiple episodes)  The pain becomes localized to portions of the abdomen. The right side could possibly be appendicitis. In an adult, the left lower portion of the abdomen could be colitis or diverticulitis.  Blood is being passed in stools or vomit (bright red or black tarry stools)  You develop chest pain, difficulty breathing, dizziness or fainting, or become confused, poorly responsive, or inconsolable (young children) If you have any other emergent concerns regarding your health  Additional Information: Abdominal (belly) pain can be caused by many things. Your caregiver performed an examination and possibly ordered blood/urine tests and imaging (CT scan, x-rays, ultrasound). Many cases can be observed and treated at home after initial evaluation in the emergency department. Even though you are being discharged home, abdominal pain can be unpredictable. Therefore, you need a repeated exam if your pain does not resolve, returns, or worsens. Most patients with abdominal pain don't have to be  admitted to the hospital or have surgery, but serious problems like appendicitis and gallbladder attacks can start out as nonspecific pain. Many abdominal conditions cannot be diagnosed in one visit, so follow-up evaluations are very important.  Your vital signs today were: BP 109/77    Pulse (!) 59    Temp 98.9 F (37.2 C) (Oral)    Resp 18    Ht  (1.549 m)    Wt 67.6 kg    LMP 05/12/2016    SpO2 98%    BMI 28.15 kg/m  If your blood pressure (bp) was elevated above 135/85 this visit, please have this repeated by your doctor within one month. --------------

## 2018-08-15 ENCOUNTER — Emergency Department (HOSPITAL_COMMUNITY): Payer: Medicaid Other

## 2018-08-15 ENCOUNTER — Encounter (HOSPITAL_COMMUNITY): Payer: Self-pay | Admitting: Emergency Medicine

## 2018-08-15 ENCOUNTER — Other Ambulatory Visit: Payer: Self-pay

## 2018-08-15 ENCOUNTER — Emergency Department (HOSPITAL_COMMUNITY)
Admission: EM | Admit: 2018-08-15 | Discharge: 2018-08-16 | Disposition: A | Discharge status: Home / Self Care | Payer: Medicaid Other | Attending: Emergency Medicine | Admitting: Emergency Medicine

## 2018-08-15 DIAGNOSIS — R29810 Facial weakness: Secondary | ICD-10-CM | POA: Diagnosis present

## 2018-08-15 DIAGNOSIS — Z79899 Other long term (current) drug therapy: Secondary | ICD-10-CM | POA: Diagnosis not present

## 2018-08-15 DIAGNOSIS — G510 Bell's palsy: Secondary | ICD-10-CM | POA: Diagnosis not present

## 2018-08-15 LAB — CBC
HCT: 41.1 % (ref 36.0–46.0)
Hemoglobin: 13.6 g/dL (ref 12.0–15.0)
MCH: 32 pg (ref 26.0–34.0)
MCHC: 33.1 g/dL (ref 30.0–36.0)
MCV: 96.7 fL (ref 80.0–100.0)
Platelets: 201 10*3/uL (ref 150–400)
RBC: 4.25 MIL/uL (ref 3.87–5.11)
RDW: 12.4 % (ref 11.5–15.5)
WBC: 7.3 10*3/uL (ref 4.0–10.5)
nRBC: 0 % (ref 0.0–0.2)

## 2018-08-15 LAB — BASIC METABOLIC PANEL
Anion gap: 6 (ref 5–15)
BUN: 15 mg/dL (ref 6–20)
CO2: 25 mmol/L (ref 22–32)
Calcium: 9.4 mg/dL (ref 8.9–10.3)
Chloride: 108 mmol/L (ref 98–111)
Creatinine, Ser: 0.74 mg/dL (ref 0.44–1.00)
GFR calc Af Amer: >60 mL/min (ref >60)
GFR calc non Af Amer: >60 mL/min (ref >60)
Glucose, Bld: 98 mg/dL (ref 70–99)
Potassium: 3.7 mmol/L (ref 3.5–5.1)
Sodium: 139 mmol/L (ref 135–145)

## 2018-08-15 LAB — CBG MONITORING, ED: Glucose-Capillary: 86 mg/dL (ref 70–99)

## 2018-08-15 MED ORDER — ARTIFICIAL TEARS OPHTHALMIC OINT: TOPICAL_OINTMENT | OPHTHALMIC | 0 refills | Status: AC | PRN

## 2018-08-15 MED ORDER — PREDNISONE 20 MG PO TABS: ORAL_TABLET | ORAL | 0 refills | Status: AC

## 2018-08-15 MED ORDER — GADOBUTROL 1 MMOL/ML IV SOLN
6.0000 mL | Freq: Once | INTRAVENOUS | Status: AC | PRN
  Administered 2018-08-15: 6 mL via INTRAVENOUS

## 2018-08-15 MED ORDER — VALACYCLOVIR HCL 500 MG PO TABS
1000.0000 mg | ORAL_TABLET | Freq: Once | ORAL | Status: AC
  Administered 2018-08-15: 1000 mg via ORAL
  Filled 2018-08-15: qty 2

## 2018-08-15 MED ORDER — PREDNISONE 20 MG PO TABS
60.0000 mg | ORAL_TABLET | Freq: Once | ORAL | Status: AC
  Administered 2018-08-15: 60 mg via ORAL
  Filled 2018-08-15: qty 3

## 2018-08-15 MED ORDER — VALACYCLOVIR HCL 1 G PO TABS: 1000.0000 mg | ORAL_TABLET | Freq: Three times a day (TID) | ORAL | 0 refills | Status: AC

## 2018-08-15 NOTE — ED Provider Notes (Signed)
[facility] EMERGENCY [facility]PART[facility] Provider Note   CSN: [id number] Arrival date & time: 08/15/18  1906    History   Chief Complaint Chief Complaint  Patient presents with   Facial Droop    HPI Destiny Fernandez is a 33 y.o. female.     HPI  33 year old female presents with right facial droop and right arm heaviness.  Woke up yesterday with decreased ability to close her right eye, right facial droop.  Some blurry vision in the right eye.  No headache.  Today she has noticed a little bit of right neck pain and like her right arm is heavy compared to the left.  No numbness.  No chest pain, shortness of breath, vomiting.  No symptoms in her legs. No fevers.  History reviewed. No pertinent past medical history.  There are no active problems to display for this patient.   Past Surgical History:  Procedure Laterality Date   CHOLECYSTECTOMY     NO PAST SURGERIES       OB History    Gravida  2   Para  2   Term  2   Preterm  0   AB  0   Living  2     SAB  0   TAB  0   Ectopic  0   Multiple  0   Live Births  1            Home Medications    Prior to Admission medications   Medication Sig Start Date End Date Taking? Authorizing Provider  artificial tears (LACRILUBE) OINT ophthalmic ointment Place into both eyes as needed for dry eyes. 08/15/18   Pricilla LovelessGoldston, Kaysen Deal, MD  ibuprofen (ADVIL,MOTRIN) 600 MG tablet Take 1 tablet (600 mg total) by mouth every 6 (six) hours. 02/06/12   Meisinger, Todd, MD  ondansetron (ZOFRAN) 4 MG tablet Take 1 tablet (4 mg total) by mouth every 6 (six) hours. 05/13/16   Audry PiliMohr, Tyler, PA-C  oxyCODONE-acetaminophen (PERCOCET/ROXICET) 5-325 MG per tablet Take 1 tablet by mouth every 4 (four) hours as needed (moderate - severe pain). 02/06/12   Meisinger, Tawanna Coolerodd, MD  predniSONE (DELTASONE) 20 MG tablet 3 tabs po daily x 2 days, then 2 tabs x 3 days, then 1.5 tabs x 3 days, then 1 tab x 3 days, then 0.5 tabs x 3 days 08/15/18    Pricilla LovelessGoldston, Vivian Okelley, MD  Prenatal Vit-Fe Fumarate-FA ([employer] PRENATAL PO) Take 1 tablet by mouth daily.    [provider]  valACYclovir (VALTREX) 1000 MG tablet Take 1 tablet (1,000 mg total) by mouth 3 (three) times daily. 08/15/18   Pricilla LovelessGoldston, Shulem Mader, MD    Family History No family history on file.  Social History Social History   Tobacco Use   Smoking status: Never Smoker   Smokeless tobacco: Never Used  Substance Use Topics   Alcohol use: No   Drug use: No     Allergies   Patient has no known allergies.   Review of Systems Review of Systems  Constitutional: Negative for fever.  Eyes: Positive for visual disturbance.  Respiratory: Negative for shortness of breath.   Cardiovascular: Negative for chest pain.  Gastrointestinal: Negative for vomiting.  Neurological: Positive for weakness. Negative for numbness and headaches.  All other systems reviewed and are negative.    Physical Exam Updated Vital Signs BP (!) 120/97 (BP Location: Right Arm)    Pulse 64    Temp 99.4 F (37.4 C)    Resp (!)  8    Ht 5\' 2"  (1.575 m)    Wt 65.8 kg    LMP 08/15/2018    SpO2 98%    BMI 26.52 kg/m   Physical Exam Vitals signs and nursing note reviewed.  Constitutional:      General: She is not in acute distress.    Appearance: She is well-developed. She is not ill-appearing or diaphoretic.  HENT:     Head: Normocephalic and atraumatic.     Right Ear: External ear normal.     Left Ear: External ear normal.     Nose: Nose normal.  Eyes:     General:        Right eye: No discharge.        Left eye: No discharge.  Cardiovascular:     Rate and Rhythm: Normal rate and regular rhythm.     Heart sounds: Normal heart sounds.  Pulmonary:     Effort: Pulmonary effort is normal.     Breath sounds: Normal breath sounds.  Abdominal:     Palpations: Abdomen is soft.     Tenderness: There is no abdominal tenderness.  Skin:    General: Skin is warm and dry.  Neurological:     Mental  Status: She is alert.     Comments: Patient's right eyelid appears to be slightly weaker than left though not dramatic.  Mild right facial droop.  Difficult to appreciate if there is any forehead weakness.  5/5 strength in all 4 extremities. Grossly normal sensation. Normal finger to nose.   Psychiatric:        Mood and Affect: Mood is not anxious.      ED Treatments / Results  Labs (all labs ordered are listed, but only abnormal results are displayed) Labs Reviewed  CBC  BASIC METABOLIC PANEL  CBG MONITORING, ED  POC URINE PREG, ED    EKG None  Radiology Mr Laqueta JeanBrain W And Wo Contrast  Result Date: 08/15/2018 CLINICAL DATA:  Right-sided facial droop EXAM: MRI HEAD WITHOUT AND WITH CONTRAST TECHNIQUE: Multiplanar, multiecho pulse sequences of the brain and surrounding structures were obtained without and with intravenous contrast. CONTRAST:  6 mL Gadavist COMPARISON:  None. FINDINGS: BRAIN: There is no acute infarct, acute hemorrhage or extra-axial collection. The white matter signal is normal for the patient's age. The cerebral and cerebellar volume are age-appropriate. There is no hydrocephalus. The midline structures are normal. There is no abnormal contrast enhancement. VASCULAR: Susceptibility-sensitive sequences show no chronic microhemorrhage or superficial siderosis. The major intracranial arterial and venous sinus flow voids are normal. SKULL AND UPPER CERVICAL SPINE: Calvarial bone marrow signal is normal. There is no skull base mass. The visualized upper cervical spine and soft tissues are normal. SINUSES/ORBITS: There are no fluid levels or advanced mucosal thickening. The mastoid air cells and middle ear cavities are free of fluid. The orbits are normal. IMPRESSION: Normal brain MRI. Electronically Signed   By: Deatra RobinsonKevin  Herman M.D.   On: 08/15/2018 23:21    Procedures Procedures (including critical care time)  Medications Ordered in ED Medications  predniSONE (DELTASONE) tablet  60 mg (has no administration in time range)  valACYclovir (VALTREX) tablet 1,000 mg (has no administration in time range)  gadobutrol (GADAVIST) 1 MMOL/ML injection 6 mL (6 mLs Intravenous Contrast Given 08/15/18 2157)     Initial Impression / Assessment and Plan / ED Course  I have reviewed the triage vital signs and the nursing notes.  Pertinent labs & imaging results that  were available during my care of the patient were reviewed by me and considered in my medical decision making (see chart for details).        Patient appears to have Bell's palsy though it is atypical.  Given the right arm "heaviness" though no findings on exam, MRI with and without was obtained after consultation with Dr. Rory Percy.  Given this is negative, can treat as typical Bell's palsy with valacyclovir and prednisone.  Follow-up with PCP and ENT.  Final Clinical Impressions(s) / ED Diagnoses   Final diagnoses:  Bell's palsy    ED Discharge Orders         Ordered    predniSONE (DELTASONE) 20 MG tablet     08/15/18 2329    valACYclovir (VALTREX) 1000 MG tablet  3 times daily     08/15/18 2329    artificial tears (LACRILUBE) OINT ophthalmic ointment  As needed     08/15/18 2329           Sherwood Gambler, MD 08/15/18 [phone]

## 2018-08-15 NOTE — ED Notes (Signed)
Patient transported to MRI 

## 2018-08-15 NOTE — ED Triage Notes (Addendum)
Pt states Saturday morning she woke up and had a facial droop to right side of face, has trouble closing her right eye and when she tries to drink fluids they fall out of the right side of her mouth. PT also has numbness to her right finger tips.

## 2018-08-15 NOTE — ED Notes (Signed)
Patient back from MRI.

## 2020-11-18 ENCOUNTER — Emergency Department (HOSPITAL_COMMUNITY)
Admission: EM | Admit: 2020-11-18 | Discharge: 2020-11-18 | Disposition: A | Discharge status: Home / Self Care | Payer: Medicaid Other | Attending: Emergency Medicine | Admitting: Emergency Medicine

## 2020-11-18 ENCOUNTER — Encounter (HOSPITAL_COMMUNITY): Payer: Self-pay | Admitting: Emergency Medicine

## 2020-11-18 ENCOUNTER — Other Ambulatory Visit: Payer: Self-pay

## 2020-11-18 ENCOUNTER — Emergency Department (HOSPITAL_COMMUNITY): Payer: Medicaid Other

## 2020-11-18 DIAGNOSIS — J101 Influenza due to other identified influenza virus with other respiratory manifestations: Secondary | ICD-10-CM | POA: Insufficient documentation

## 2020-11-18 DIAGNOSIS — Z20822 Contact with and (suspected) exposure to covid-19: Secondary | ICD-10-CM | POA: Insufficient documentation

## 2020-11-18 DIAGNOSIS — R059 Cough, unspecified: Secondary | ICD-10-CM | POA: Diagnosis present

## 2020-11-18 LAB — RESP PANEL BY RT-PCR (FLU A&B, COVID) ARPGX2
Influenza A by PCR: POSITIVE — AB
Influenza B by PCR: NEGATIVE
SARS Coronavirus 2 by RT PCR: NEGATIVE

## 2020-11-18 MED ORDER — ONDANSETRON 4 MG PO TBDP: 4.0000 mg | ORAL_TABLET | Freq: Three times a day (TID) | ORAL | 0 refills | Status: AC | PRN

## 2020-11-18 MED ORDER — ACETAMINOPHEN 325 MG PO TABS: 650.0000 mg | ORAL_TABLET | Freq: Once | ORAL | Status: DC

## 2020-11-18 NOTE — ED Triage Notes (Signed)
Pt states she was cleaning with bleach 1 week ago.  States she lost her voice the next day and is now coughing.

## 2020-11-18 NOTE — ED Provider Notes (Signed)
[facility] EMERGENCY DEPARTMENT Provider Note   CSN: [id number] Arrival date & time: 11/18/20  9509     History Chief Complaint  Patient presents with   Cough    Maygen Sirico is a 35 y.o. female who presents the emergency department with a 2-day history of cough, sore throat, nasal congestion, general malaise, nausea and vomiting.  She states that a week ago she was cleaning and inhaled some chemicals which she attributes to her symptoms.  She has taken over-the-counter lozenges, tea with honey, and Tylenol which have not improved her sore throat.  She denies any fevers, chills, chest pain, shortness of breath, abdominal pain.  Her sore throat has been constant since onset and she rates it moderate in severity.   Cough     History reviewed. No pertinent past medical history.  There are no problems to display for this patient.   Past Surgical History:  Procedure Laterality Date   CHOLECYSTECTOMY     NO PAST SURGERIES       OB History     Gravida  2   Para  2   Term  2   Preterm  0   AB  0   Living  2      SAB  0   IAB  0   Ectopic  0   Multiple  0   Live Births  1           No family history on file.  Social History   Tobacco Use   Smoking status: Never   Smokeless tobacco: Never  Substance Use Topics   Alcohol use: No   Drug use: No    Home Medications Prior to Admission medications   Medication Sig Start Date End Date Taking? Authorizing Provider  ondansetron (ZOFRAN ODT) 4 MG disintegrating tablet Take 1 tablet (4 mg total) by mouth every 8 (eight) hours as needed for nausea or vomiting. 11/18/20  Yes Meredeth Ide, Rodolfo Notaro M, PA-C  artificial tears (LACRILUBE) OINT ophthalmic ointment Place into both eyes as needed for dry eyes. 08/15/18   Pricilla Loveless, MD  ibuprofen (ADVIL,MOTRIN) 600 MG tablet Take 1 tablet (600 mg total) by mouth every 6 (six) hours. 02/06/12   Meisinger, Todd, MD  ondansetron (ZOFRAN) 4 MG tablet  Take 1 tablet (4 mg total) by mouth every 6 (six) hours. 05/13/16   Audry Pili, PA-C  oxyCODONE-acetaminophen (PERCOCET/ROXICET) 5-325 MG per tablet Take 1 tablet by mouth every 4 (four) hours as needed (moderate - severe pain). 02/06/12   Meisinger, Tawanna Cooler, MD  predniSONE (DELTASONE) 20 MG tablet 3 tabs po daily x 2 days, then 2 tabs x 3 days, then 1.5 tabs x 3 days, then 1 tab x 3 days, then 0.5 tabs x 3 days 08/15/18   Pricilla Loveless, MD  Prenatal Vit-Fe Fumarate-FA ([employer] PRENATAL PO) Take 1 tablet by mouth daily.    [provider]  valACYclovir (VALTREX) 1000 MG tablet Take 1 tablet (1,000 mg total) by mouth 3 (three) times daily. 08/15/18   Pricilla Loveless, MD    Allergies    Patient has no known allergies.  Review of Systems   Review of Systems  Respiratory:  Positive for cough.   All other systems reviewed and are negative.  Physical Exam Updated Vital Signs BP 117/87 (BP Location: Right Arm)   Pulse 81   Temp 100.2 F (37.9 C) (Oral)   Resp 18   LMP 10/28/2020   SpO2 97%  Physical Exam Vitals and nursing note reviewed.  Constitutional:      General: She is not in acute distress.    Appearance: Normal appearance.  HENT:     Head: Normocephalic and atraumatic.     Mouth/Throat:     Comments: Mild pharyngeal erythema.  No uvular edema or erythema.  Uvula is midline.  No tonsillar exudate or edema.  Patient talking in complete sentences.  Voice is normal. Eyes:     General:        Right eye: No discharge.        Left eye: No discharge.  Cardiovascular:     Comments: Regular rate and rhythm.  S1/S2 are distinct without any evidence of murmur, rubs, or gallops.  Radial pulses are 2+ bilaterally.  Dorsalis pedis pulses are 2+ bilaterally.  No evidence of pedal edema. Pulmonary:     Comments: Clear to auscultation bilaterally.  Normal effort.  No respiratory distress.  No evidence of wheezes, rales, or rhonchi heard throughout. Abdominal:     General: Abdomen is flat.  Bowel sounds are normal. There is no distension.     Tenderness: There is no abdominal tenderness. There is no guarding or rebound.  Musculoskeletal:        General: Normal range of motion.     Cervical back: Neck supple.  Skin:    General: Skin is warm and dry.     Findings: No rash.  Neurological:     General: No focal deficit present.     Mental Status: She is alert.  Psychiatric:        Mood and Affect: Mood normal.        Behavior: Behavior normal.    ED Results / Procedures / Treatments   Labs (all labs ordered are listed, but only abnormal results are displayed) Labs Reviewed  RESP PANEL BY RT-PCR (FLU A&B, COVID) ARPGX2 - Abnormal; Notable for the following components:      Result Value   Influenza A by PCR POSITIVE (*)    All other components within normal limits    EKG None  Radiology DG Chest 2 View  Result Date: 11/18/2020 CLINICAL DATA:  Cough EXAM: CHEST - 2 VIEW COMPARISON:  None. FINDINGS: Normal heart size. Normal mediastinal contour. No pneumothorax. No pleural effusion. Lungs appear clear, with no acute consolidative airspace disease and no pulmonary edema. Surgical clips are seen in the right upper quadrant of the abdomen. IMPRESSION: No active cardiopulmonary disease. Electronically Signed   By: Delbert Phenix M.D.   On: 11/18/2020 08:45    Procedures Procedures   Medications Ordered in ED Medications  acetaminophen (TYLENOL) tablet 650 mg (has no administration in time range)    ED Course  I have reviewed the triage vital signs and the nursing notes.  Pertinent labs & imaging results that were available during my care of the patient were reviewed by me and considered in my medical decision making (see chart for details).    MDM Rules/Calculators/A&P                          Lively Haberman is a 35 y.o. female who presents the emergency department with flulike symptoms.  Initial work-up was ordered in triage.  Patient tested positive for  influenza.  Chest x-ray did not reveal any signs of overlying pneumonia or pneumothorax.  I have a low suspicion that this is related to her chemical inhalation over a week ago.  I also have a low suspicion for peritonsillar abscess or RPA at this time.  Fever resolved with Tylenol given the department today.  She is otherwise hemodynamically stable.  She is safe for discharge.  I will prescribe her some Zofran for her nausea vomiting and instructed her to get some Chloraseptic over-the-counter spray at the pharmacy.  Patient expressed full understanding and all questions and concerns were addressed.   Final Clinical Impression(s) / ED Diagnoses Final diagnoses:  Influenza A    Rx / DC Orders ED Discharge Orders          Ordered    ondansetron (ZOFRAN ODT) 4 MG disintegrating tablet  Every 8 hours PRN        11/18/20 0959             Teressa Lower, PA-C 11/18/20 1008    Benjiman Core, MD 11/18/20 1504

## 2020-11-18 NOTE — Discharge Instructions (Signed)
You tested positive for influenza A.  Please take Zofran for nausea and vomiting. You can get Chloraseptic throat spray for sore throat.  Please drink plenty of fluids and get plenty of rest.  This will resolve on its own.  Please return to the emergency department if you experience worsening fevers, intractable nausea and vomiting, shortness of breath, severe cough, or any other concerns you might have.

## 2020-11-18 NOTE — ED Provider Notes (Signed)
Emergency Medicine Provider Triage Evaluation Note  Destiny Fernandez , a 35 y.o. female  was evaluated in triage.  Pt complains of cough that began after cleaning with bleach about 1 week ago. She also complains of an itching sensation in her throat, runny nose, and body aches. She was unaware she had a fever today - 100.2. She denies recent sick contacts. She has not received her flu vaccine this year. She has tried multiple OTC meds for cough without relief.  Review of Systems  Positive: + cough, rhinorrhea, body aches, fever Negative: - SOB  Physical Exam  BP 117/87 (BP Location: Right Arm)   Pulse 81   Temp 100.2 F (37.9 C) (Oral)   Resp 18   SpO2 97%  Gen:   Awake, no distress   Resp:  Normal effort  MSK:   Moves extremities without difficulty  Other:    Medical Decision Making  Medically screening exam initiated at 8:29 AM.  Appropriate orders placed.  Betty Daidone was informed that the remainder of the evaluation will be completed by another provider, this initial triage assessment does not replace that evaluation, and the importance of remaining in the ED until their evaluation is complete.     Tanda Rockers, PA-C 11/18/20 0830    Virgina Norfolk, DO 11/18/20 [phone]

## 2021-01-24 IMAGING — MR MRI HEAD WITHOUT AND WITH CONTRAST
12 of 14 series · 38 of 48 positions shown · IV contrast (gadavist)
Comparison: None.

CLINICAL DATA: Right-sided facial droop

EXAM:
MRI HEAD WITHOUT AND WITH CONTRAST
TECHNIQUE: Multiplanar, multiecho pulse sequences of the brain and surrounding
structures were obtained without and with intravenous contrast.
CONTRAST:  6 mL Gadavist

[Series 5: DWI · axial · 3.0mm · 0.88mm/px · z∈[-62,+77]mm · 6 of 96 slices shown (1 of 4)]
[im 1/96]
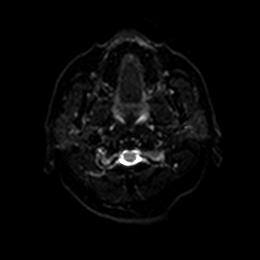
[im 20/96]
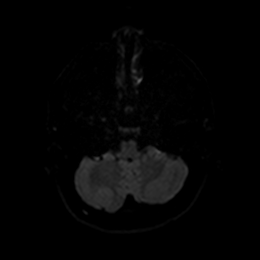
[im 39/96]
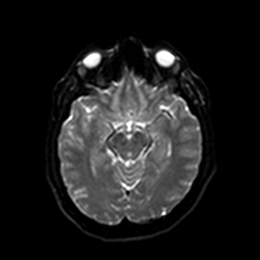
[im 58/96]
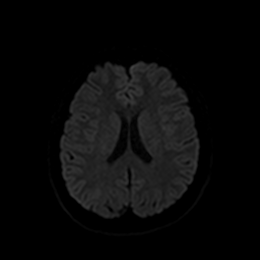
[im 77/96]
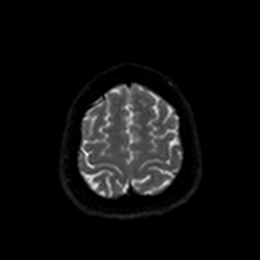
[im 96/96]
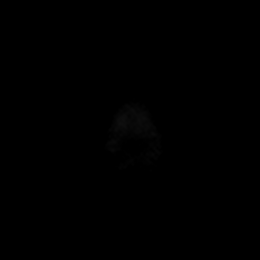

[Series 6: DWI · axial · 3.0mm · 0.88mm/px · z∈[-62,+77]mm · 4 of 48 slices shown (2 of 4)]
[im 1/48]
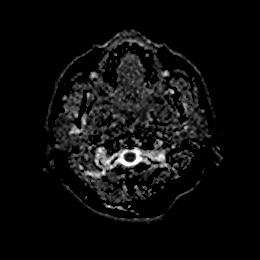
[im 16/48]
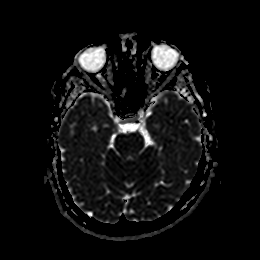
[im 32/48]
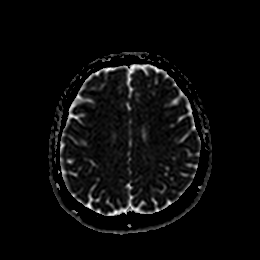
[im 48/48]
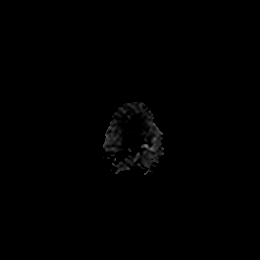

[Series 7: DWI · coronal · 4.0mm · 0.88mm/px · 5 of 64 slices shown (3 of 4)]
[im 1/64]
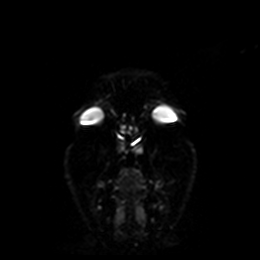
[im 16/64]
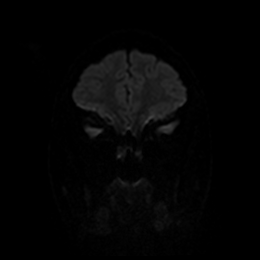
[im 32/64]
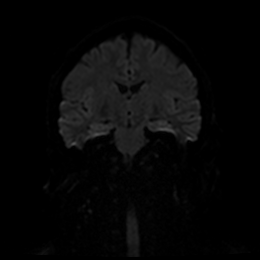
[im 48/64]
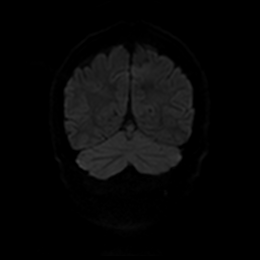
[im 64/64]
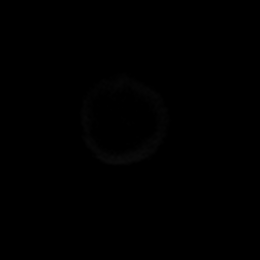

[Series 8: DWI · coronal · 4.0mm · 0.88mm/px · 2 of 32 slices shown (4 of 4)]
[im 1/32]
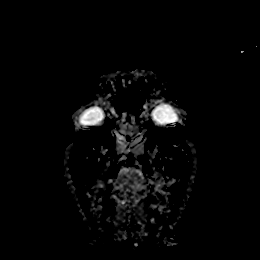
[im 32/32]
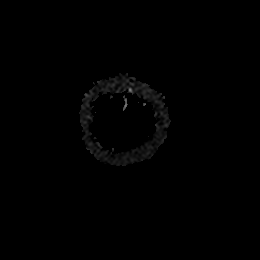

[Series 9: T1 · sagittal · 5.0mm · 0.75mm/px · 2 of 25 slices shown]
[im 1/25]
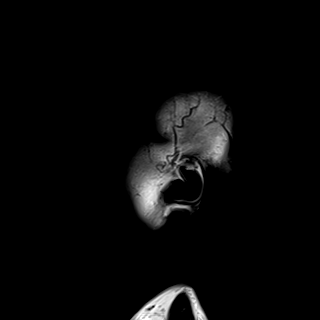
[im 25/25]
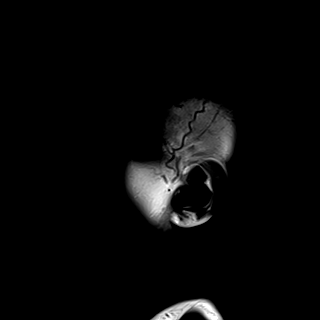

[Series 10: T2 · axial · 5.0mm · 0.72mm/px · z∈[-67,+76]mm · 2 of 25 slices shown]
[im 1/25]
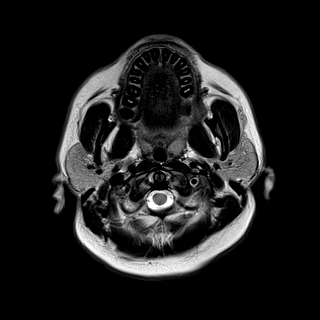
[im 25/25]
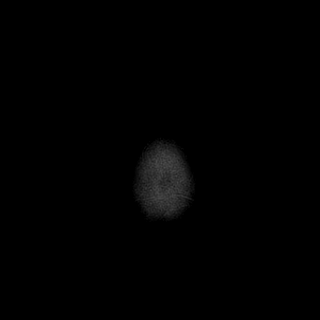

[Series 11: FLAIR · axial · 5.0mm · 0.45mm/px · z∈[-67,+75]mm · 2 of 25 slices shown]
[im 1/25]
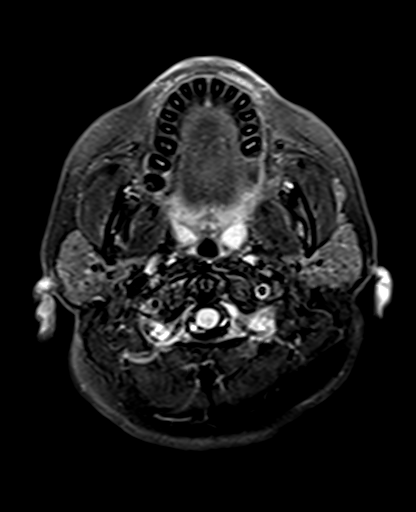
[im 25/25]
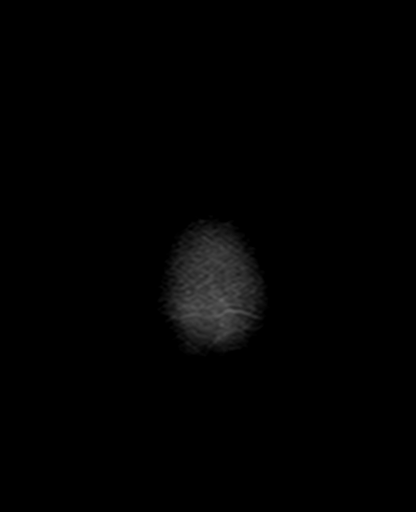

[Series 13: pha_images · axial · 3.0mm · 0.90mm/px · z∈[-80,+86]mm · 4 of 56 slices shown]
[im 1/56]
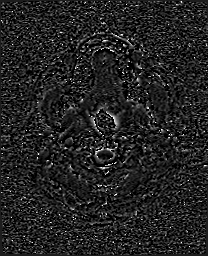
[im 19/56]
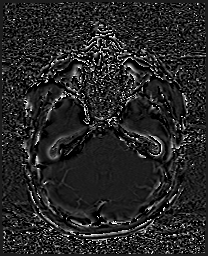
[im 37/56]
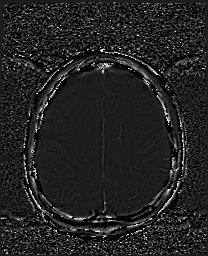
[im 56/56]
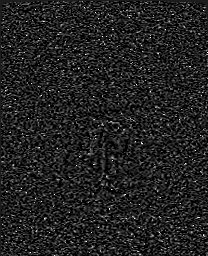

[Series 14: swi_images · axial · 3.0mm · 0.90mm/px · z∈[-83,+92]mm · 5 of 60 slices shown]
[im 1/60]
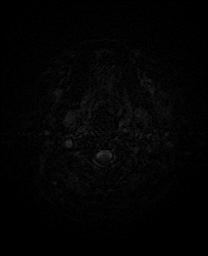
[im 15/60]
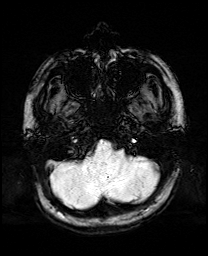
[im 30/60]
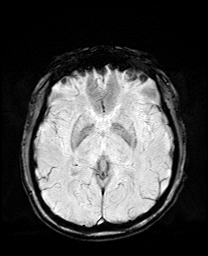
[im 45/60]
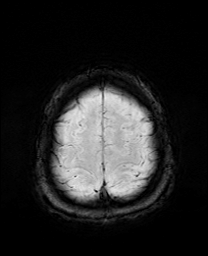
[im 60/60]
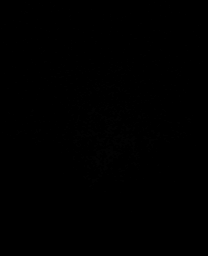

[Series 17: T2 post-contrast · coronal · 5.0mm · 0.72mm/px · 2 of 28 slices shown]
[im 1/28]
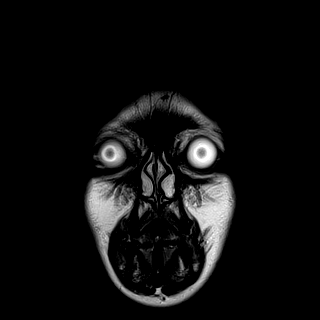
[im 28/28]
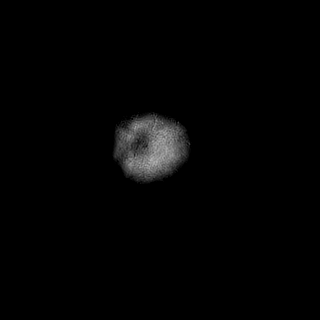

[Series 19: T1 post-contrast · coronal · 5.0mm · 0.34mm/px · 2 of 28 slices shown (1 of 2)]
[im 1/28]
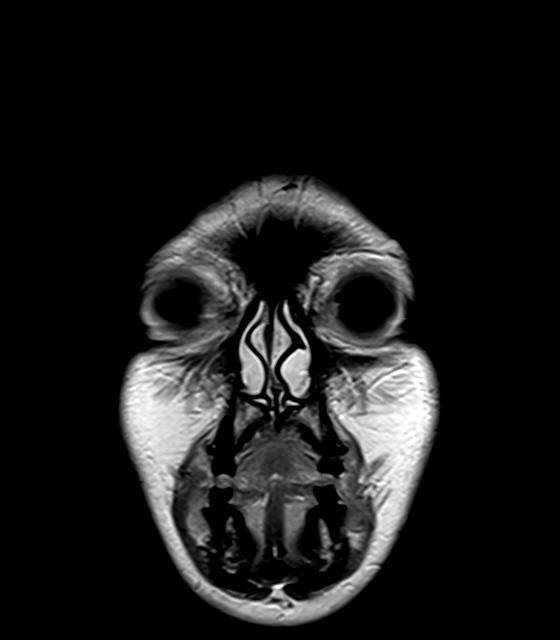
[im 28/28]
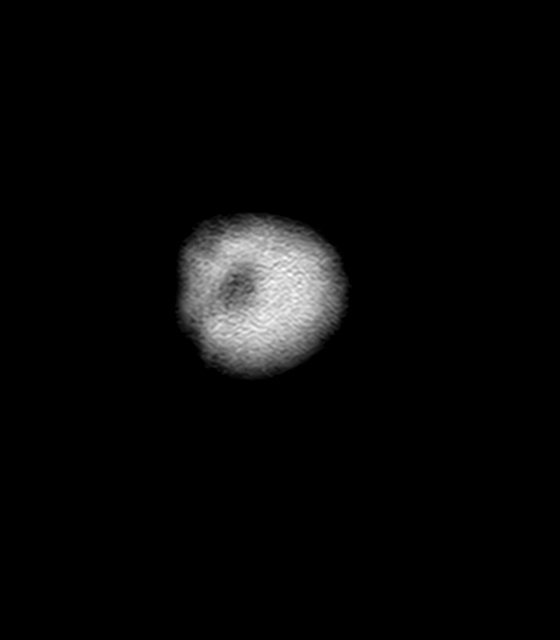

[Series 20: T1 post-contrast · sagittal · 5.0mm · 0.75mm/px · 2 of 25 slices shown (2 of 2)]
[im 1/25]
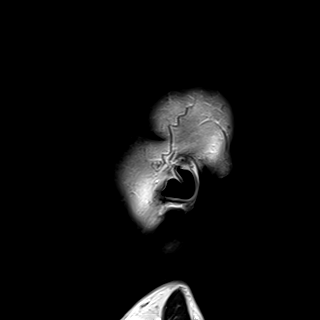
[im 25/25]
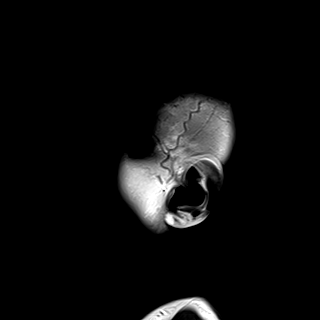

[38 of 48 positions shown; findings below may reference images not displayed]

FINDINGS: BRAIN: There is no acute infarct, acute hemorrhage or extra-axial
collection. The white matter signal is normal for the patient's age.
The cerebral and cerebellar volume are age-appropriate. There is no
hydrocephalus. The midline structures are normal. There is no
abnormal contrast enhancement.

VASCULAR: Susceptibility-sensitive sequences show no chronic
microhemorrhage or superficial siderosis. The major intracranial
arterial and venous sinus flow voids are normal.

SKULL AND UPPER CERVICAL SPINE: Calvarial bone marrow signal is
normal. There is no skull base mass. The visualized upper cervical
spine and soft tissues are normal.

SINUSES/ORBITS: There are no fluid levels or advanced mucosal
thickening. The mastoid air cells and middle ear cavities are free
of fluid. The orbits are normal.
IMPRESSION: Normal brain MRI.

## 2023-04-30 IMAGING — DX DG CHEST 2V
2 series · 2 of 2 positions shown · non-contrast
Comparison: None.

CLINICAL DATA: Cough

EXAM:
CHEST - 2 VIEW

[chest pa]
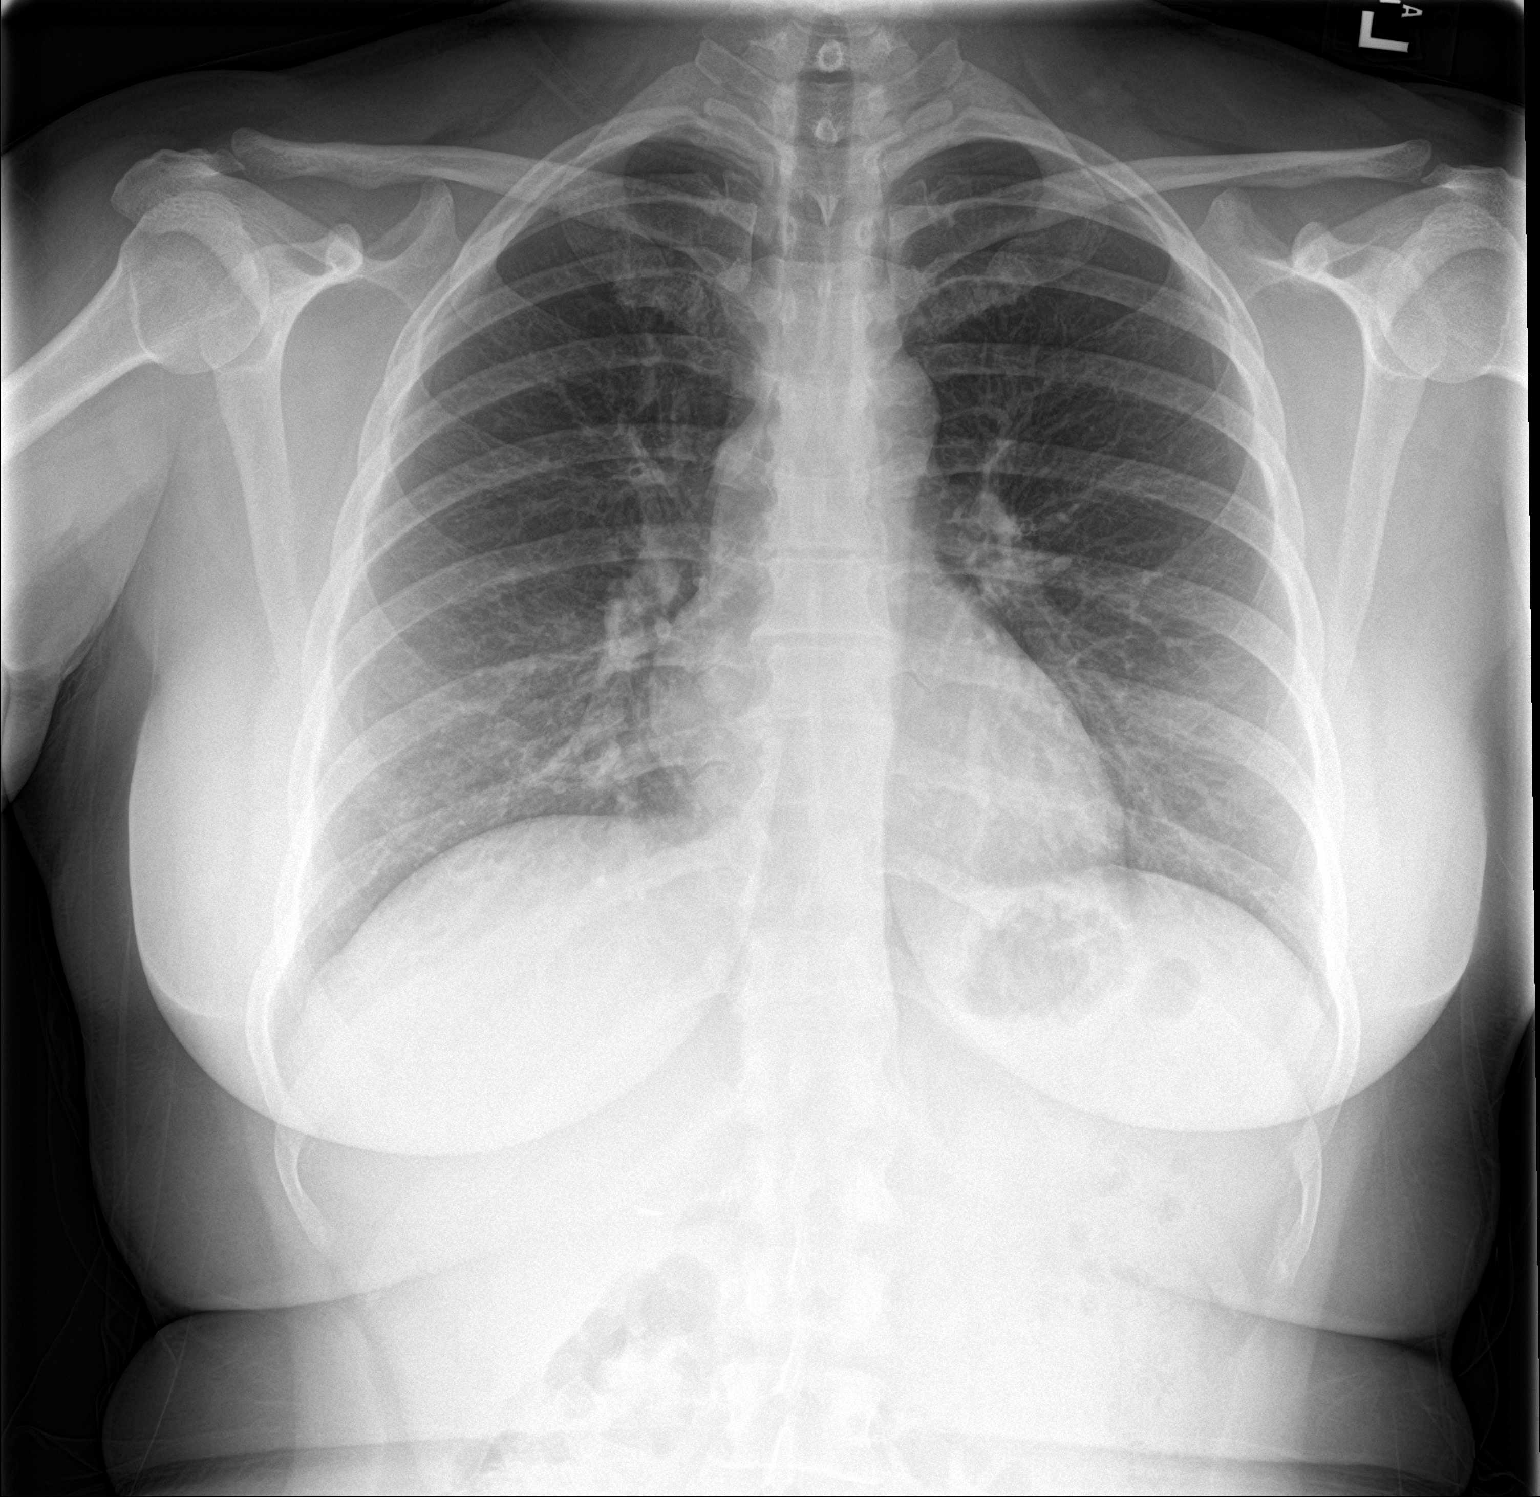

[chest lat]
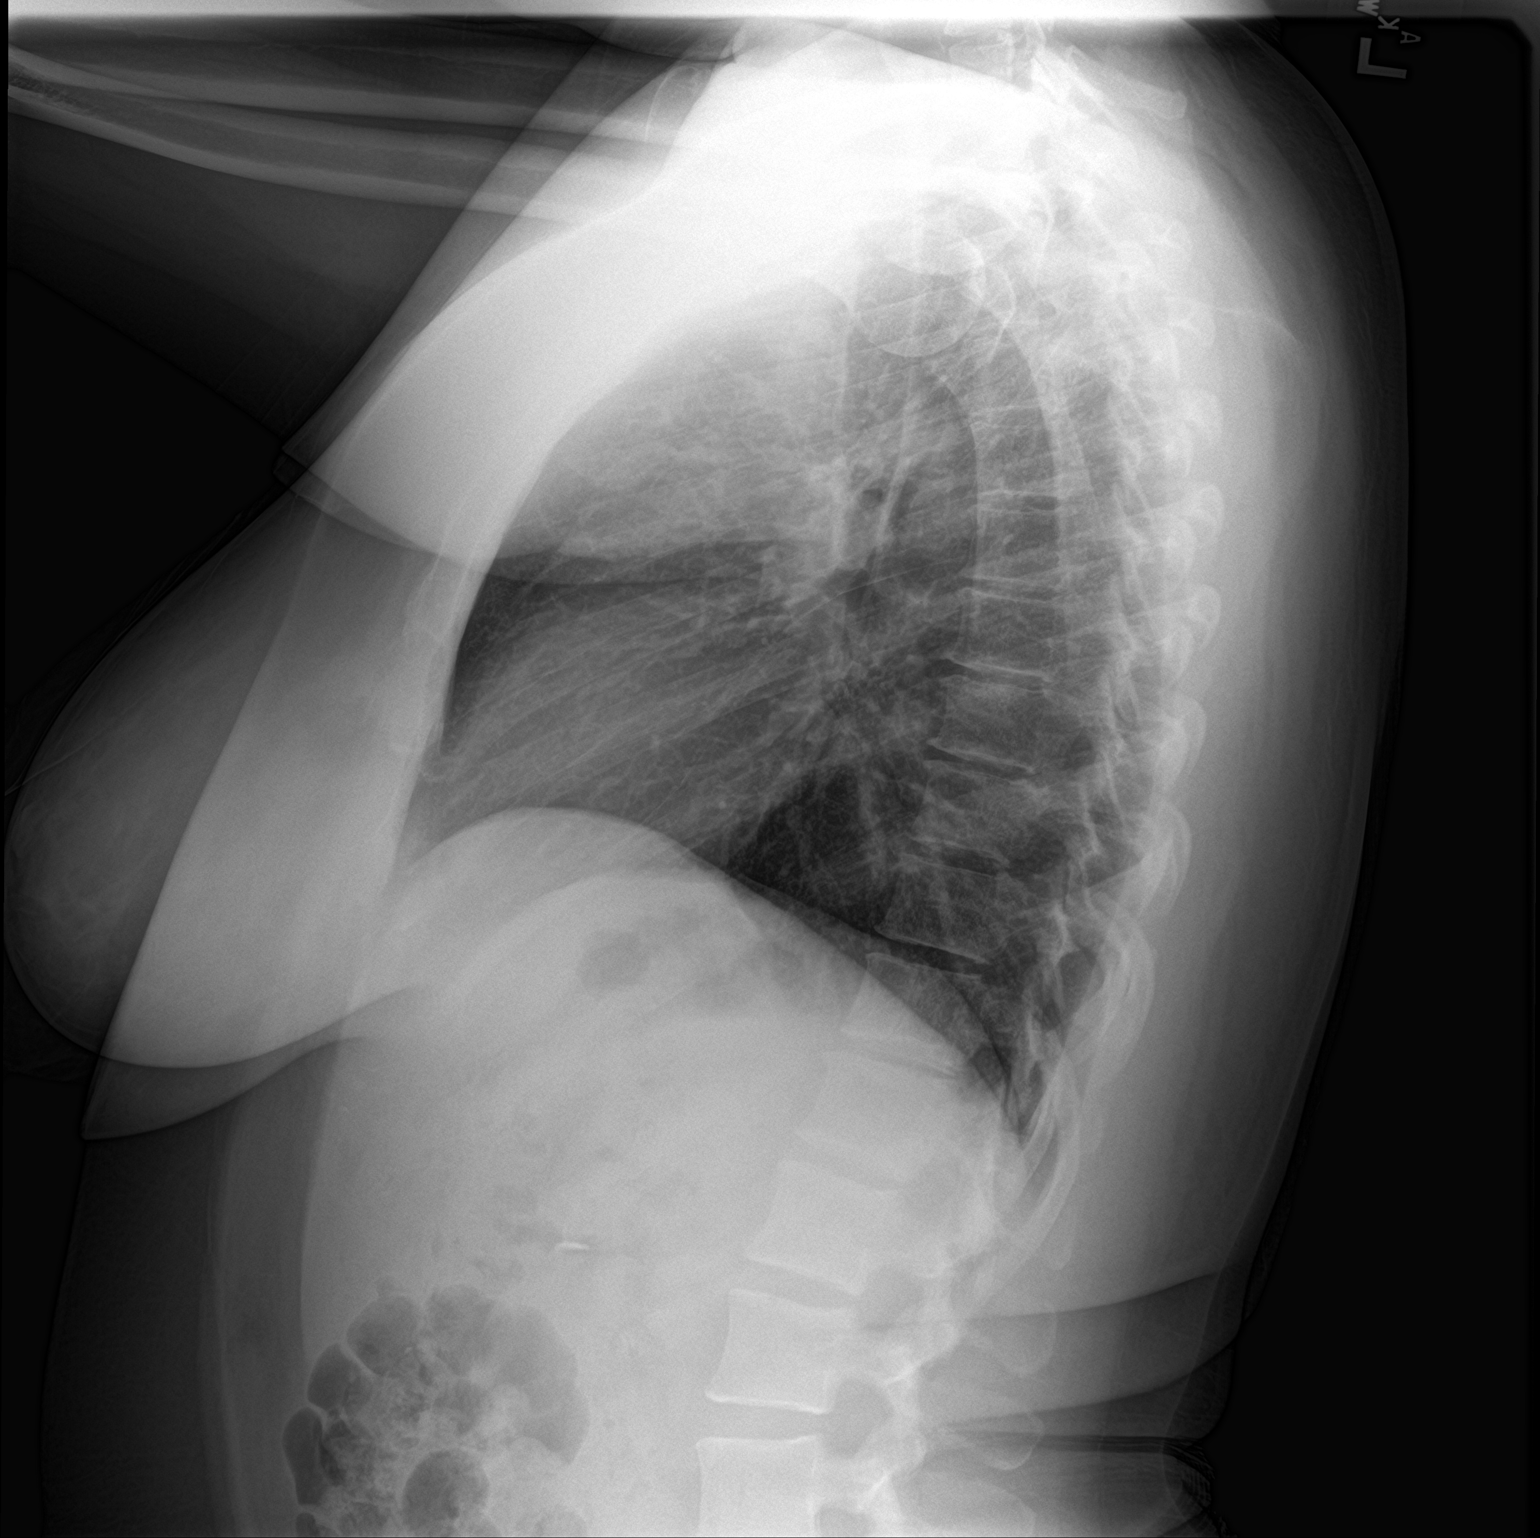

[2 of 2 positions shown; findings below may reference images not displayed]

FINDINGS: Normal heart size. Normal mediastinal contour. No pneumothorax. No
pleural effusion. Lungs appear clear, with no acute consolidative
airspace disease and no pulmonary edema. Surgical clips are seen in
the right upper quadrant of the abdomen.
IMPRESSION: No active cardiopulmonary disease.

## 2023-07-15 ENCOUNTER — Ambulatory Visit: Attending: Family Medicine | Admitting: Physical Therapy

## 2023-12-21 ENCOUNTER — Institutional Professional Consult (permissible substitution): Payer: Self-pay | Admitting: Student
